# Patient Record
Sex: Male | Born: 1955 | Race: White | Hispanic: No | Marital: Single
Health system: Midwestern US, Academic
[De-identification: ages and names within clinical notes are randomized; demographics above are authoritative.]

---

## 2017-09-15 ENCOUNTER — Encounter: Admit: 2017-09-15 | Discharge: 2017-09-15 | Payer: MEDICARE

## 2017-09-15 ENCOUNTER — Emergency Department: Admit: 2017-09-15 | Discharge: 2017-09-15

## 2017-09-15 ENCOUNTER — Emergency Department: Admit: 2017-09-15 | Discharge: 2017-09-15 | Disposition: A | Discharge status: Home / Self Care | Payer: MEDICARE

## 2017-09-15 ENCOUNTER — Emergency Department: Admit: 2017-09-15 | Discharge: 2017-09-15 | Payer: MEDICARE

## 2017-09-15 ENCOUNTER — Encounter: Admit: 2017-09-15 | Discharge: 2017-09-15

## 2017-09-15 DIAGNOSIS — E119 Type 2 diabetes mellitus without complications: ICD-10-CM

## 2017-09-15 DIAGNOSIS — J449 Chronic obstructive pulmonary disease, unspecified: Principal | ICD-10-CM

## 2017-09-15 DIAGNOSIS — F411 Generalized anxiety disorder: Principal | ICD-10-CM

## 2017-09-15 DIAGNOSIS — M25552 Pain in left hip: Secondary | ICD-10-CM

## 2017-09-15 DIAGNOSIS — R000 Tachycardia, unspecified: ICD-10-CM

## 2017-09-15 DIAGNOSIS — R609 Edema, unspecified: ICD-10-CM

## 2017-09-15 DIAGNOSIS — R0602 Shortness of breath: ICD-10-CM

## 2017-09-15 DIAGNOSIS — G5600 Carpal tunnel syndrome, unspecified upper limb: ICD-10-CM

## 2017-09-15 DIAGNOSIS — M4800 Spinal stenosis, site unspecified: ICD-10-CM

## 2017-09-15 DIAGNOSIS — F419 Anxiety disorder, unspecified: ICD-10-CM

## 2017-09-15 DIAGNOSIS — K759 Inflammatory liver disease, unspecified: ICD-10-CM

## 2017-09-15 DIAGNOSIS — I10 Essential (primary) hypertension: ICD-10-CM

## 2017-09-15 DIAGNOSIS — S065X9A Traumatic subdural hemorrhage with loss of consciousness of unspecified duration, initial encounter: ICD-10-CM

## 2017-09-15 LAB — POC BLOOD GAS VEN
Lab: 25 MMOL/L
Lab: 30 mmHg — ABNORMAL LOW (ref 33–48)
Lab: 39 mmHg (ref 36–50)
Lab: 58 % (ref 55–71)
Lab: 7.4 — ABNORMAL HIGH (ref 7.30–7.40)

## 2017-09-15 LAB — COMPREHENSIVE METABOLIC PANEL
Lab: 0.8 mg/dL (ref 0.4–1.24)
Lab: 104 MMOL/L — ABNORMAL LOW (ref 98–110)
Lab: 13 mg/dL (ref 7–25)
Lab: 136 MMOL/L — ABNORMAL LOW (ref 137–147)
Lab: 179 mg/dL — ABNORMAL HIGH (ref 70–100)
Lab: 6.6 g/dL — ABNORMAL HIGH (ref 6.0–8.0)
Lab: 9 mg/dL — ABNORMAL HIGH (ref 8.5–10.6)

## 2017-09-15 LAB — CBC AND DIFF
Lab: 0 10*3/uL (ref 0–0.20)
Lab: 10 10*3/uL (ref 4.5–11.0)

## 2017-09-15 LAB — D-DIMER: Lab: 413 ng{FEU}/mL — ABNORMAL HIGH (ref <500)

## 2017-09-15 LAB — TROPONIN-I

## 2017-09-15 LAB — POC LACTATE
Lab: 2.3 MMOL/L — ABNORMAL HIGH (ref 0.5–2.0)
Lab: 1.2 MMOL/L (ref 0.5–2.0)

## 2017-09-15 MED ORDER — MORPHINE 2 MG/ML IV CRTG
4-8 mg | INTRAVENOUS | 0 refills | Status: CP | PRN
Start: 2017-09-15 — End: ?
  Administered 2017-09-15 (×2): 4 mg via INTRAVENOUS

## 2017-09-15 MED ORDER — LORAZEPAM 1 MG PO TAB
.5 mg | Freq: Once | ORAL | 0 refills | Status: CP
Start: 2017-09-15 — End: ?
  Administered 2017-09-15: 11:00:00 0.5 mg via ORAL

## 2017-09-15 MED ORDER — OXYCODONE-ACETAMINOPHEN 5-325 MG PO TAB
1-2 | Freq: Once | ORAL | 0 refills | Status: CP
Start: 2017-09-15 — End: ?
  Administered 2017-09-15: 12:00:00 2 via ORAL

## 2017-09-15 MED ORDER — MORPHINE 4 MG/ML IV CRTG
6-10 mg | INTRAVENOUS | 0 refills | Status: DC | PRN
Start: 2017-09-15 — End: 2017-09-15
  Administered 2017-09-15: 10:00:00 8 mg via INTRAVENOUS
  Administered 2017-09-15: 11:00:00 10 mg via INTRAVENOUS

## 2017-09-15 MED ORDER — SODIUM CHLORIDE 0.9 % IJ SOLN
50 mL | Freq: Once | INTRAVENOUS | 0 refills | Status: CP
Start: 2017-09-15 — End: ?
  Administered 2017-09-15: 11:00:00 50 mL via INTRAVENOUS

## 2017-09-15 MED ORDER — IOHEXOL 350 MG IODINE/ML IV SOLN
75 mL | Freq: Once | INTRAVENOUS | 0 refills | Status: CP
Start: 2017-09-15 — End: ?
  Administered 2017-09-15: 11:00:00 75 mL via INTRAVENOUS

## 2017-09-15 NOTE — ED Notes
Lactate 2.28 reported to Dr. Hyacinth MeekerMiller.

## 2017-09-15 NOTE — ED Notes
61 y.o M to ED 30 via EMS with complaints of left hip pain, rated 10/10. Pt reports he had a left hip arthroplasty on 11/27 at [facility]. Pt reports he has been residing in a Rehab facility; verbalizes inadequate pain control. Reports last dose of Percocet at 2030. Pt denies fevers or chills; afebrile at this time. Pt denies chest pain, abdominal pain, N/V, or HA. Pt AAO4. Pt tachycardic and tachypneic. Bruising noted to medial aspect of left upper leg. Strength and sensation intact. Skin warm, dry, and race appropriate. Pt on BP, cardiac, and O2 monitors. Pt resting on cart, wheels locked, in lowest position; side rails raised x 2. Call light and belongings within reach. Awaiting MD evaluation.

## 2017-09-15 NOTE — ED Notes
Patient educated on discharge instructions, home care, and follow up care with Ortho. Patient verbalized understanding and had all questions answered by this RN at this time. Patient waiting for EMS to ride back to facility.

## 2017-09-15 NOTE — ED Notes
Patient helped up to commode with two RN's, patient then transferred back to bed with staff assistance.

## 2017-09-15 NOTE — ED Notes
MD feels initial wheeze like tone coming from upper airway.  No resp care indicated under ED Resp Protocol, no dilator therapy given at this time, MD/RN aware.  Will continue to monitor.

## 2017-09-15 NOTE — Case Management (ED)
Case Management Progress Note    NAME:Maxwell Lawrence                          MRN: [id number]              DOB:12/21/1955          AGE: 61 y.o.  ADMISSION DATE: 09/15/2017             DAYS ADMITTED: LOS: 0 days      Todays Date: 09/15/2017    Plan  RN informed SW pt ready to return to his care facility.     SW confirmed w/ pt that he is from [facility] and returns via ambulance.     [facility] SW contacted [facility] and arranged ambulance transport back to facility.    SW attempted to call [facility] x2 and phone just rang w/ no answer.    SW provided RN w/ PCS and Face Sheet.     Interventions  ? Support      ? Info or Referral      ? Discharge Planning      ? Medication Needs      ? Financial      ? Legal      ? Other        Disposition  ? Expected Discharge Date       ? Transportation      ? Next Level of Care (Acute Psych discharges only)      ? Discharge Disposition                                          Durable Medical Equipment      No service has been selected for the patient.      [place] [facility]      No service has been selected for the patient.      [facility]      No service has been selected for the patient.      [facility]      No service has been selected for the patient.        Cayden Rautio E. Jackquline BoschHester, Night Shift Social Work, [facility] [phone]

## 2017-09-15 NOTE — ED Notes
All belongings gathered and placed in belonging bag with patient labels at bedside.  The bag(s) contain(s) the following:    Clothing: gray shorts, blue shirt, jacket  Shoes: shoes  Credit cards: 1 debit card  Dentures/Glasses/Hearing aids: Rx glasses    All belongings placed in 1 bag(s).    Belongings disposition: belongings with patient at bedside.

## 2017-09-15 NOTE — ED Notes
Records request [facility] to [facility].

## 2017-09-21 LAB — CULTURE-BLOOD W/SENSITIVITY

## 2018-04-01 IMAGING — CR PELVIS
2 series · 2 of 2 positions shown · non-contrast
Comparison: none

[pelvis]
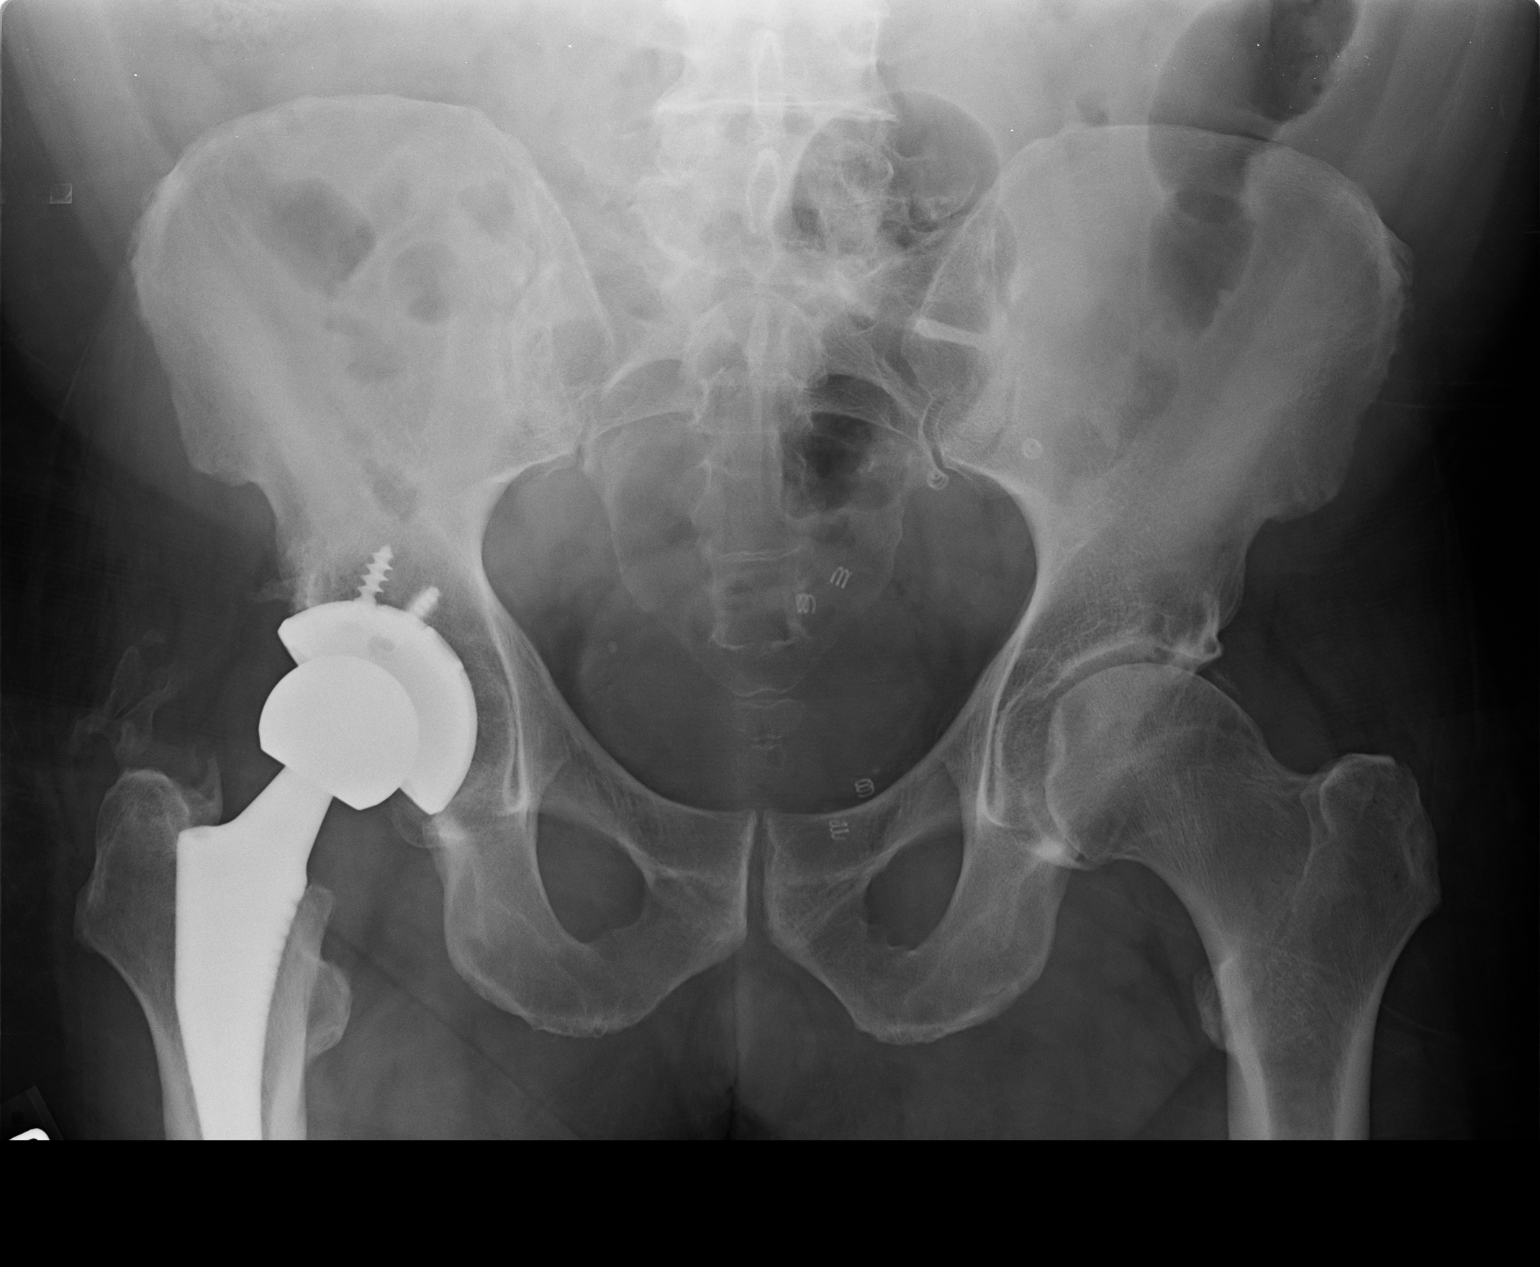

[hip ap]
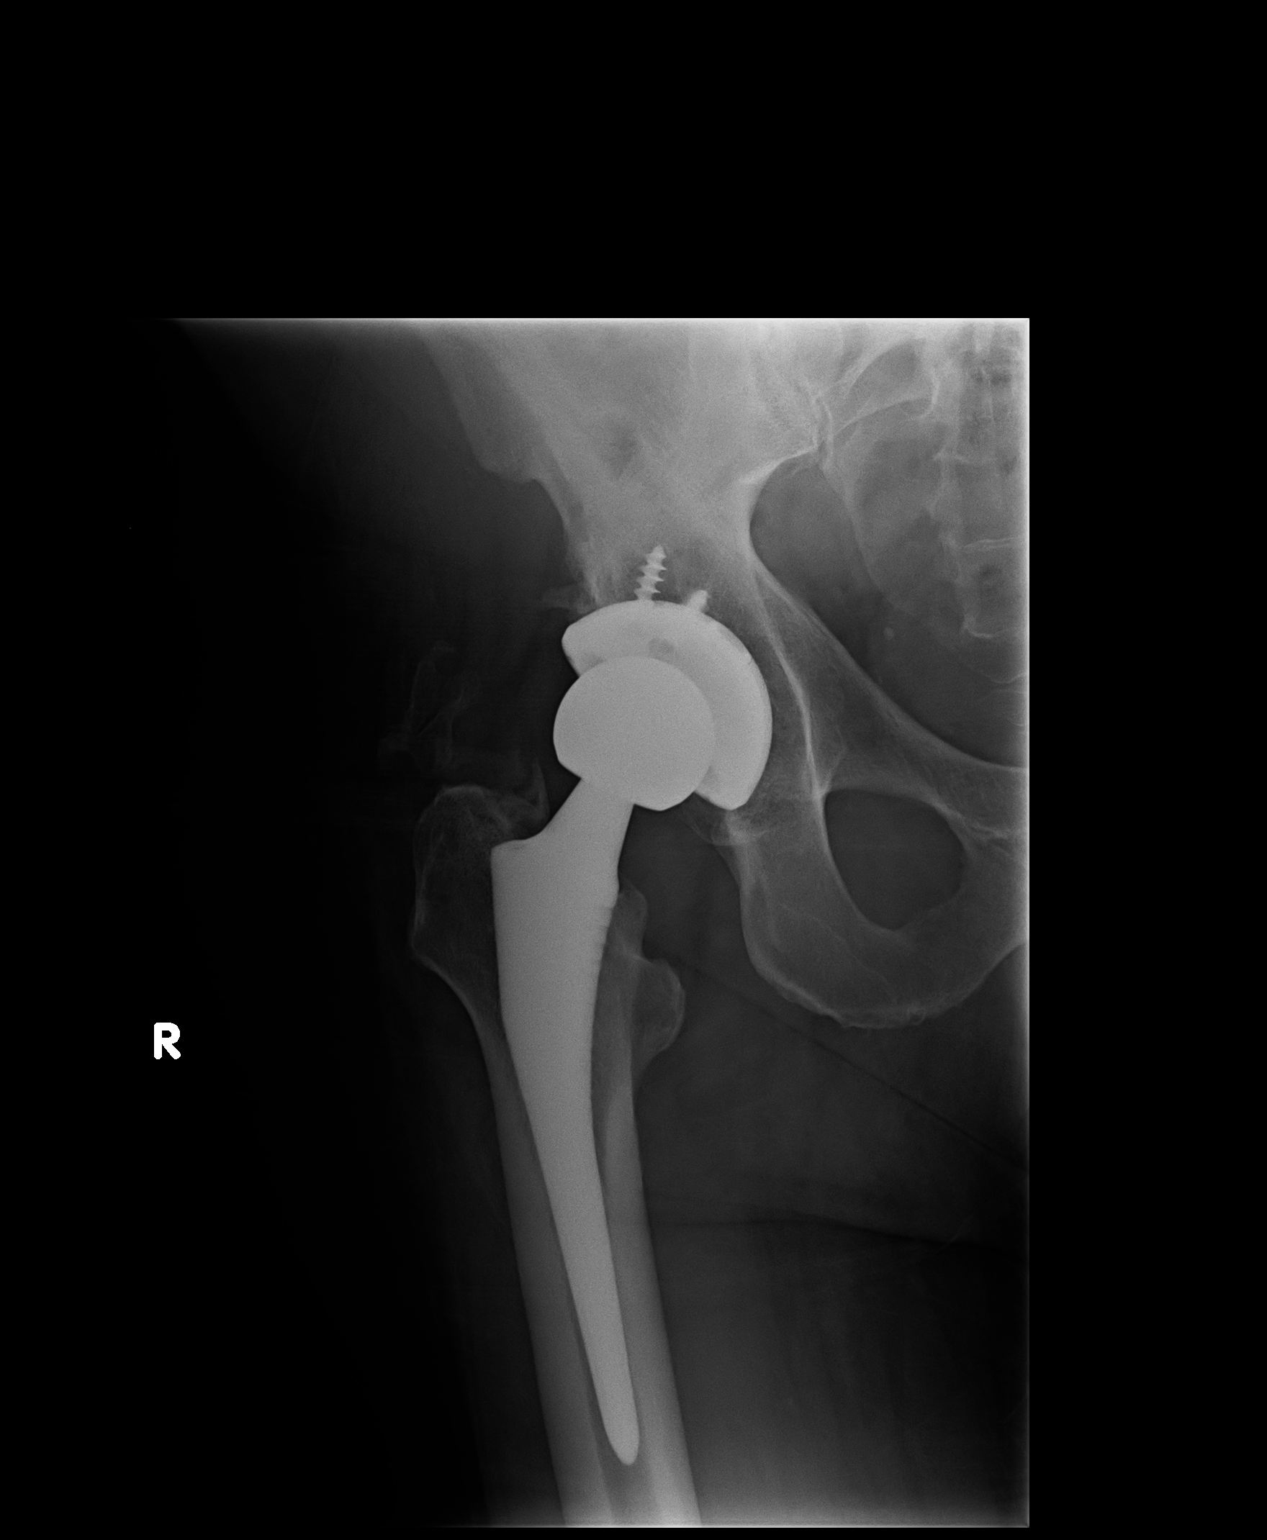

[2 of 2 positions shown; findings below may reference images not displayed]

EXAM

RADIOLOGICAL EXAMINATION, HIP; COMPLETE 2 VIEWS, AP PELVIS CPT 36159;

INDICATION

RIGHT HIP PAIN
PT STATES HAS RIGHT HIP PAIN. FALL ON 05/17/17. HX OF RIGHT HIP ARTHOPLASTY.
TJ/MT

COMPARISONS

PREVIOUS EXAMINATION DATED 03/04/2014.

FINDINGS

Bipolar right hip prosthesis is identified in place. The orthopedic hardware appears intact. There
are no fractures or subluxations. There are no abnormal masses or calcifications. There are no
blastic or lytic lesions. Mild degenerative changes of the left hip joint are noted.

IMPRESSION

Status post right hip replacement. No acute abnormalities. Mild degenerative changes of the left
hip joint are redemonstrated.

## 2022-03-23 ENCOUNTER — Encounter: Admit: 2022-03-23 | Discharge: 2022-03-23 | Payer: MEDICARE
